# Patient Record
Sex: Male | Born: 1955 | Hispanic: No | Marital: Married
Health system: Southern US, Community
[De-identification: ages and names within clinical notes are randomized; demographics above are authoritative.]

---

## 1999-03-21 ENCOUNTER — Other Ambulatory Visit: Admission: RE | Admit: 1999-03-21 | Discharge: 1999-03-21 | Payer: Self-pay | Admitting: Family Medicine

## 2001-07-01 ENCOUNTER — Ambulatory Visit (HOSPITAL_COMMUNITY): Admission: RE | Admit: 2001-07-01 | Discharge: 2001-07-01 | Payer: Self-pay | Admitting: Family Medicine

## 2001-07-01 ENCOUNTER — Encounter: Payer: Self-pay | Admitting: Family Medicine

## 2007-03-21 ENCOUNTER — Ambulatory Visit: Payer: Self-pay | Admitting: Internal Medicine

## 2007-03-21 DIAGNOSIS — L0292 Furuncle, unspecified: Secondary | ICD-10-CM | POA: Insufficient documentation

## 2007-03-21 DIAGNOSIS — L0293 Carbuncle, unspecified: Secondary | ICD-10-CM

## 2007-03-21 HISTORY — DX: Furuncle, unspecified: L02.92

## 2009-09-03 ENCOUNTER — Ambulatory Visit: Payer: Self-pay | Admitting: Internal Medicine

## 2009-09-03 ENCOUNTER — Observation Stay (HOSPITAL_COMMUNITY): Admission: EM | Admit: 2009-09-03 | Discharge: 2009-09-03 | Payer: Self-pay | Admitting: Emergency Medicine

## 2010-09-22 LAB — CBC
HCT: 42.2 % (ref 39.0–52.0)
Hemoglobin: 14.4 g/dL (ref 13.0–17.0)
MCHC: 34.2 g/dL (ref 30.0–36.0)
MCV: 89.5 fL (ref 78.0–100.0)
Platelets: 216 10*3/uL (ref 150–400)
RBC: 4.71 MIL/uL (ref 4.22–5.81)
RDW: 13 % (ref 11.5–15.5)
WBC: 5.3 10*3/uL (ref 4.0–10.5)

## 2010-09-22 LAB — DIFFERENTIAL
Basophils Absolute: 0 10*3/uL (ref 0.0–0.1)
Basophils Relative: 0 % (ref 0–1)
Eosinophils Absolute: 0.1 10*3/uL (ref 0.0–0.7)
Eosinophils Relative: 2 % (ref 0–5)
Lymphocytes Relative: 22 % (ref 12–46)
Lymphs Abs: 1.2 10*3/uL (ref 0.7–4.0)
Monocytes Absolute: 0.4 10*3/uL (ref 0.1–1.0)
Monocytes Relative: 7 % (ref 3–12)
Neutro Abs: 3.7 10*3/uL (ref 1.7–7.7)
Neutrophils Relative %: 70 % (ref 43–77)

## 2010-09-22 LAB — COMPREHENSIVE METABOLIC PANEL
ALT: 14 U/L (ref 0–53)
AST: 17 U/L (ref 0–37)
Albumin: 3.9 g/dL (ref 3.5–5.2)
Alkaline Phosphatase: 40 U/L (ref 39–117)
BUN: 9 mg/dL (ref 6–23)
CO2: 28 mEq/L (ref 19–32)
Calcium: 9 mg/dL (ref 8.4–10.5)
Chloride: 108 mEq/L (ref 96–112)
Creatinine, Ser: 0.91 mg/dL (ref 0.4–1.5)
GFR calc Af Amer: >60 mL/min (ref >60)
GFR calc non Af Amer: >60 mL/min (ref >60)
Glucose, Bld: 99 mg/dL (ref 70–99)
Potassium: 3.9 mEq/L (ref 3.5–5.1)
Sodium: 141 mEq/L (ref 135–145)
Total Bilirubin: 0.6 mg/dL (ref 0.3–1.2)
Total Protein: 6.2 g/dL (ref 6.0–8.3)

## 2010-09-22 LAB — POCT CARDIAC MARKERS
CKMB, poc: <1 ng/mL — ABNORMAL LOW (ref 1.0–8.0)
CKMB, poc: <1 ng/mL — ABNORMAL LOW (ref 1.0–8.0)
Myoglobin, poc: 44.6 ng/mL (ref 12–200)

## 2011-03-22 IMAGING — CR DG CHEST 2V
2 series · 2 of 2 positions shown · non-contrast
Comparison: None

CLINICAL DATA: Chest pain, arrhythmia

CHEST - 2 VIEW

[w chest pa]
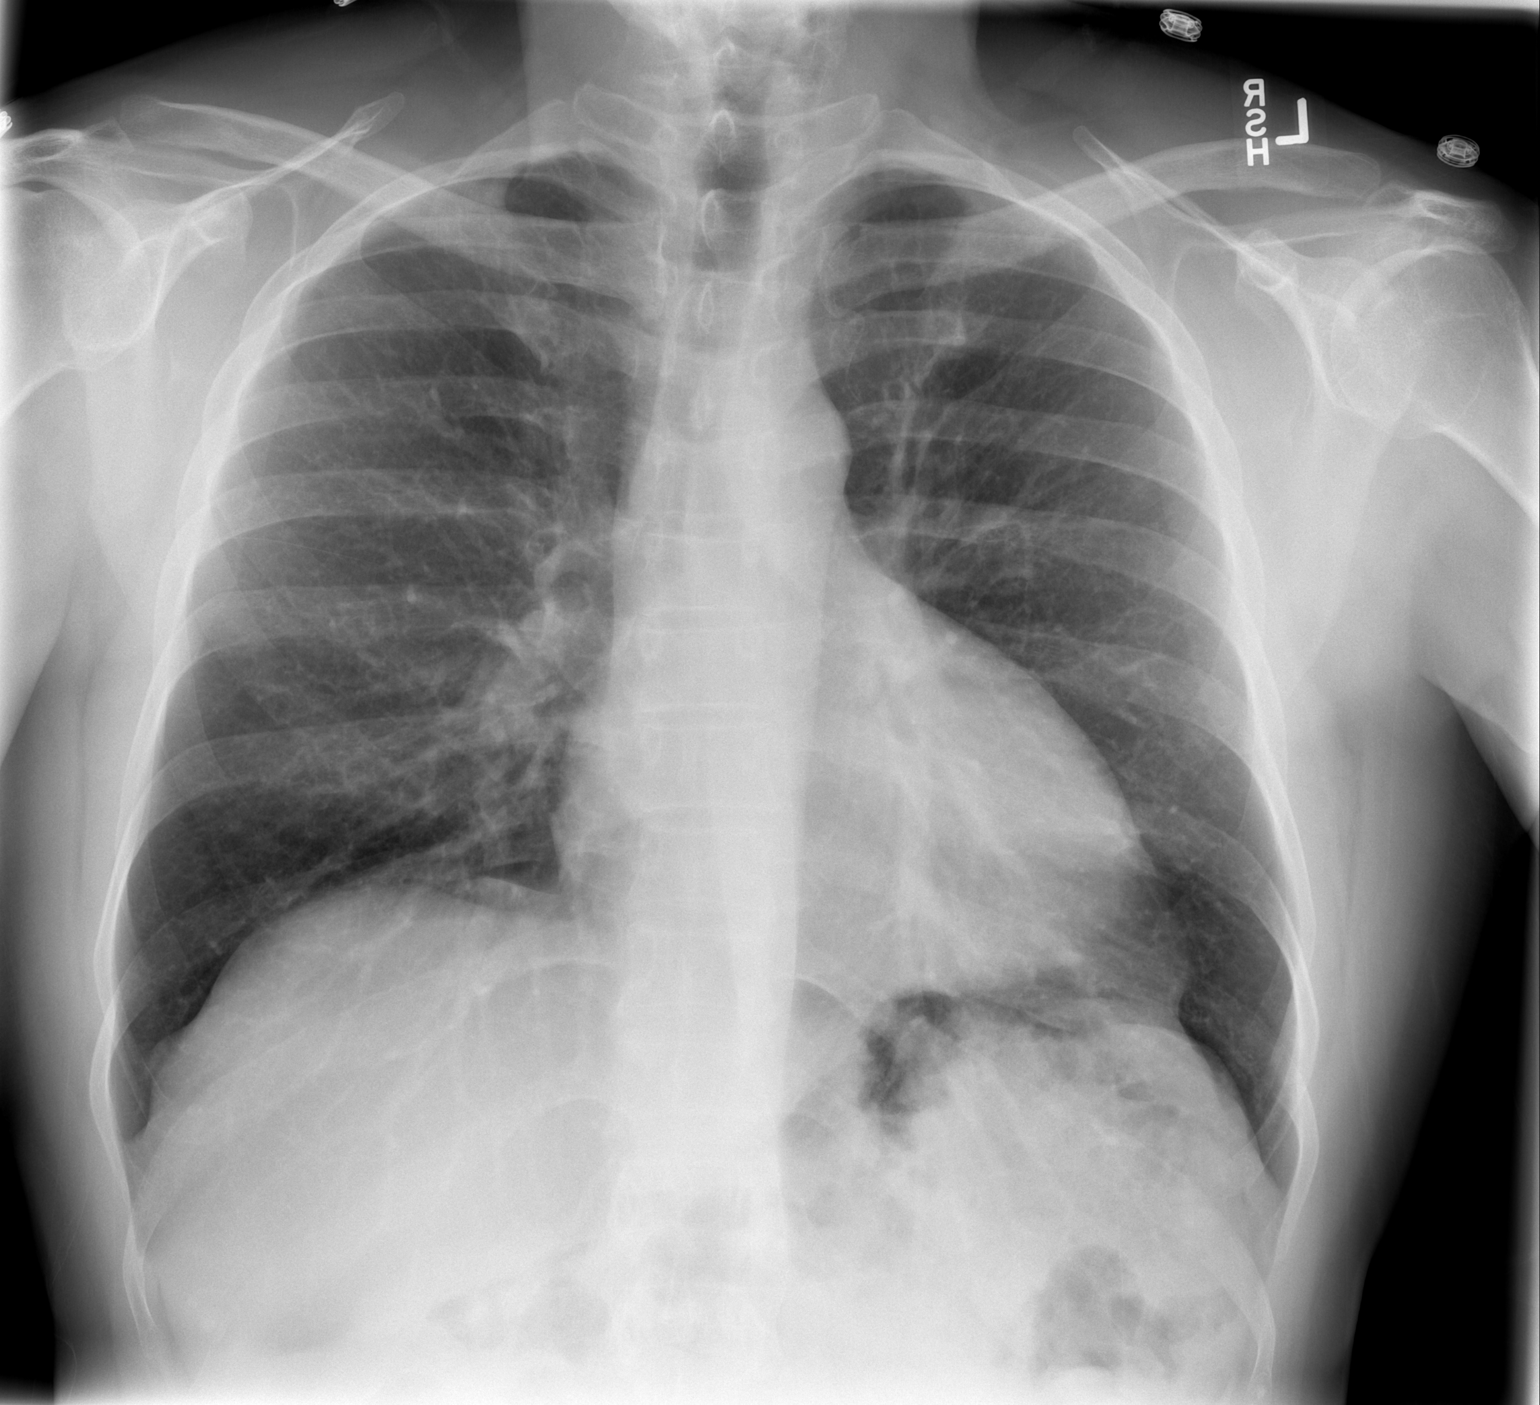

[w chest lat]
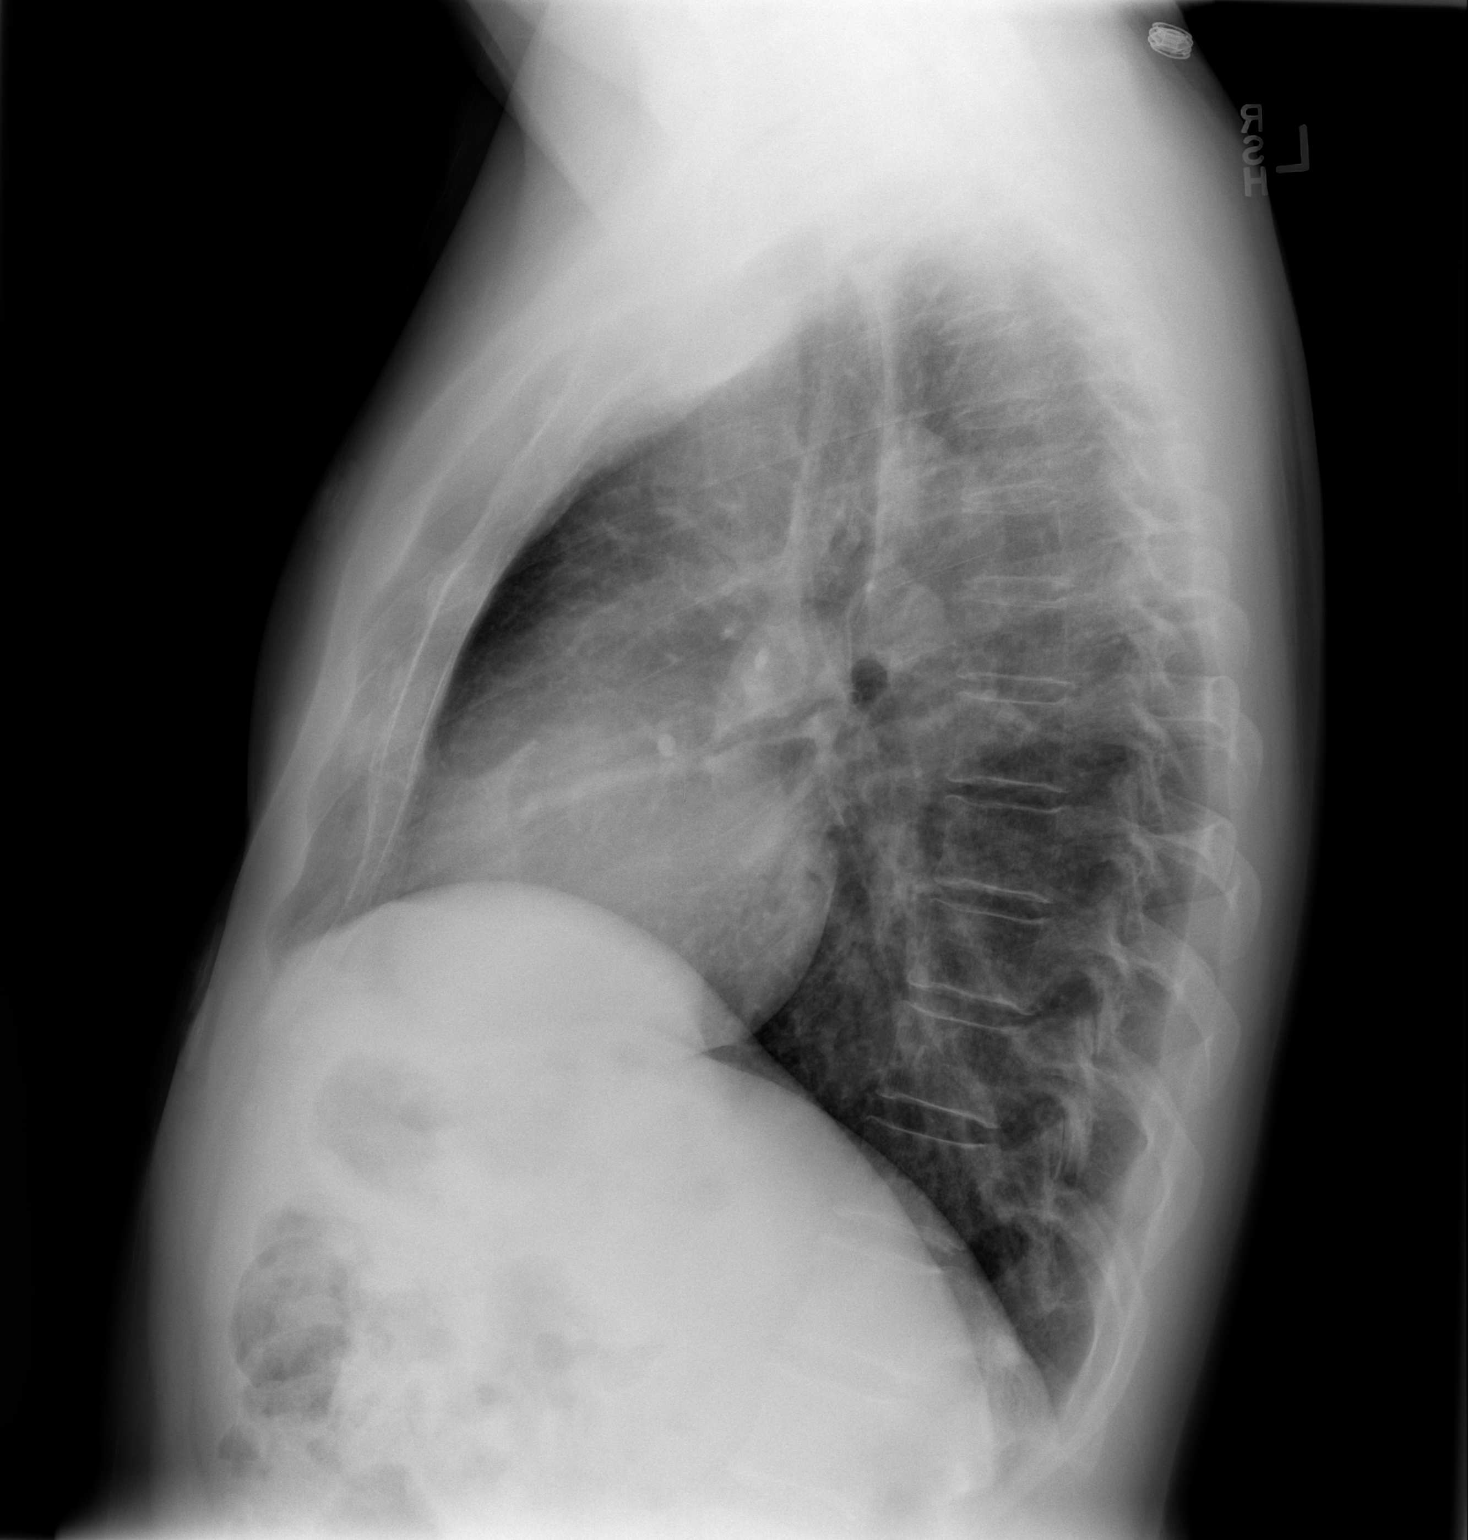

[2 of 2 positions shown; findings below may reference images not displayed]

FINDINGS: Upper normal heart size.
Normal mediastinal contours and pulmonary vascularity.
Question minimal linear subsegmental atelectasis retrocardiac left
lower lobe.
Remaining lungs clear.
No pleural effusion or pneumothorax.
Bones unremarkable.
IMPRESSION: Question minimal linear atelectasis left lower lobe.

## 2016-06-29 DIAGNOSIS — Z0000 Encounter for general adult medical examination without abnormal findings: Secondary | ICD-10-CM | POA: Diagnosis not present

## 2016-08-19 DIAGNOSIS — M7711 Lateral epicondylitis, right elbow: Secondary | ICD-10-CM | POA: Diagnosis not present

## 2016-09-17 DIAGNOSIS — H5319 Other subjective visual disturbances: Secondary | ICD-10-CM | POA: Diagnosis not present

## 2016-09-17 DIAGNOSIS — H35341 Macular cyst, hole, or pseudohole, right eye: Secondary | ICD-10-CM | POA: Diagnosis not present

## 2016-09-17 DIAGNOSIS — H35373 Puckering of macula, bilateral: Secondary | ICD-10-CM | POA: Diagnosis not present

## 2016-09-17 DIAGNOSIS — H35413 Lattice degeneration of retina, bilateral: Secondary | ICD-10-CM | POA: Diagnosis not present

## 2016-10-03 DIAGNOSIS — S70361A Insect bite (nonvenomous), right thigh, initial encounter: Secondary | ICD-10-CM | POA: Diagnosis not present

## 2016-10-03 DIAGNOSIS — S90561A Insect bite (nonvenomous), right ankle, initial encounter: Secondary | ICD-10-CM | POA: Diagnosis not present

## 2016-10-07 DIAGNOSIS — S70369A Insect bite (nonvenomous), unspecified thigh, initial encounter: Secondary | ICD-10-CM | POA: Diagnosis not present

## 2017-01-18 DIAGNOSIS — E7800 Pure hypercholesterolemia, unspecified: Secondary | ICD-10-CM | POA: Diagnosis not present

## 2017-01-18 DIAGNOSIS — Z87898 Personal history of other specified conditions: Secondary | ICD-10-CM | POA: Diagnosis not present

## 2017-01-18 DIAGNOSIS — I10 Essential (primary) hypertension: Secondary | ICD-10-CM | POA: Diagnosis not present

## 2017-01-18 DIAGNOSIS — Z1211 Encounter for screening for malignant neoplasm of colon: Secondary | ICD-10-CM | POA: Diagnosis not present

## 2017-04-29 DIAGNOSIS — B078 Other viral warts: Secondary | ICD-10-CM | POA: Diagnosis not present

## 2017-04-29 DIAGNOSIS — L810 Postinflammatory hyperpigmentation: Secondary | ICD-10-CM | POA: Diagnosis not present

## 2017-04-30 DIAGNOSIS — R972 Elevated prostate specific antigen [PSA]: Secondary | ICD-10-CM | POA: Diagnosis not present

## 2017-05-17 DIAGNOSIS — N400 Enlarged prostate without lower urinary tract symptoms: Secondary | ICD-10-CM | POA: Diagnosis not present

## 2017-05-17 DIAGNOSIS — R972 Elevated prostate specific antigen [PSA]: Secondary | ICD-10-CM | POA: Diagnosis not present

## 2017-05-17 DIAGNOSIS — R310 Gross hematuria: Secondary | ICD-10-CM | POA: Diagnosis not present

## 2017-06-29 DIAGNOSIS — Z0000 Encounter for general adult medical examination without abnormal findings: Secondary | ICD-10-CM | POA: Diagnosis not present

## 2017-08-20 DIAGNOSIS — R05 Cough: Secondary | ICD-10-CM | POA: Diagnosis not present

## 2017-09-16 DIAGNOSIS — H35373 Puckering of macula, bilateral: Secondary | ICD-10-CM | POA: Diagnosis not present

## 2017-09-16 DIAGNOSIS — H35341 Macular cyst, hole, or pseudohole, right eye: Secondary | ICD-10-CM | POA: Diagnosis not present

## 2017-09-16 DIAGNOSIS — H35413 Lattice degeneration of retina, bilateral: Secondary | ICD-10-CM | POA: Diagnosis not present

## 2018-01-25 DIAGNOSIS — Z0000 Encounter for general adult medical examination without abnormal findings: Secondary | ICD-10-CM | POA: Diagnosis not present

## 2018-01-25 DIAGNOSIS — I10 Essential (primary) hypertension: Secondary | ICD-10-CM | POA: Diagnosis not present

## 2018-01-25 DIAGNOSIS — E7800 Pure hypercholesterolemia, unspecified: Secondary | ICD-10-CM | POA: Diagnosis not present

## 2018-05-20 DIAGNOSIS — Z8249 Family history of ischemic heart disease and other diseases of the circulatory system: Secondary | ICD-10-CM | POA: Diagnosis not present

## 2018-05-20 DIAGNOSIS — I10 Essential (primary) hypertension: Secondary | ICD-10-CM | POA: Diagnosis not present

## 2018-05-30 DIAGNOSIS — N400 Enlarged prostate without lower urinary tract symptoms: Secondary | ICD-10-CM | POA: Diagnosis not present

## 2018-05-30 DIAGNOSIS — R972 Elevated prostate specific antigen [PSA]: Secondary | ICD-10-CM | POA: Diagnosis not present

## 2018-05-30 DIAGNOSIS — R310 Gross hematuria: Secondary | ICD-10-CM | POA: Diagnosis not present

## 2020-01-26 ENCOUNTER — Ambulatory Visit (INDEPENDENT_AMBULATORY_CARE_PROVIDER_SITE_OTHER): Payer: 59 | Admitting: Family Medicine

## 2020-01-26 ENCOUNTER — Other Ambulatory Visit: Payer: Self-pay

## 2020-01-26 ENCOUNTER — Encounter: Payer: Self-pay | Admitting: Family Medicine

## 2020-01-26 VITALS — BP 132/82 | Ht 66.0 in | Wt 178.0 lb

## 2020-01-26 DIAGNOSIS — M7918 Myalgia, other site: Secondary | ICD-10-CM

## 2020-01-26 DIAGNOSIS — I10 Essential (primary) hypertension: Secondary | ICD-10-CM | POA: Diagnosis not present

## 2020-01-26 NOTE — Progress Notes (Signed)
  Taylor Barton - 64 y.o. male MRN [id number]  Date of birth: 08/24/1955    SUBJECTIVE:      Chief Complaint:/ HPI:  Right arm pain: Patient is a pleasant 64 year old male presenting to clinic today for concerns for right lateral elbow pain.  Reports that a couple years ago he had a bout of tendinitis on the lateral elbow (tennis elbow) which he treated with a pressure sleeve and band exercises.  These help to keep the pain away and help the arm to function.  Patient reports that the end of May he was working out when he added on 5 extra pounds to a barbell curl and started feeling significant pain and tenderness at the proximal aspect of his forearm and about the brachioradialis muscle.  Pain is worse with forearm supination and neutral position with full flexion.  Denies any falls, injuries.  Has been treating the site occasionally with ice and has been wearing a compression device over the proximal forearm to reduce this pain.  Primarily wears the compression device when exercising, reports he is not persistent with icing.  Pain improves with ice and rest.  Pain gets worse when he overexerts.  Has not tried any Tylenol or Advil due to being on losartan for high blood pressure.  No report of rashes, fevers, myalgias, arm tingling, numbness, weakness, swelling, bruising, or loss of sensation appreciated.   ROS:     See HPI  PERTINENT  PMH / PSH FH / / SH:  Past Medical, Surgical, Social, and Family History Reviewed & Updated in the EMR.  Pertinent findings include:  -History of hypertension - takes Telmisartan-HCTZ -Takes glucosamine/chondroitin for joint health -Takes Viagra as needed  OBJECTIVE: BP (!) 132/82   Ht 5\' 6"  (1.676 m)   Wt 178 lb (80.7 kg)   BMI 28.73 kg/m   Physical Exam:  Vital signs are reviewed.  GEN: Alert and oriented, NAD Pulm: Breathing unlabored PSY: normal mood, congruent affect  MSK: Right elbow: - Inspection: no obvious deformity. No swelling, erythema or  bruising - Palpation: Minimal TTP to proximal brachial radialis.  No TTP over lateral epicondyle. - ROM: full active ROM in flexion and extension.  Pain with flexion particularly while forearm in neutral position.  No crepitus.  Pain with resisted radial deviation.  No pain at lateral epicondyle with wrist extension and 3rd digit extension. - Strength: 5/5 strength in wrist flexion and extension without pain. - Neuro: NV intact distally  Limited ultrasound, right elbow/proximal forearm: Grade 1 strain in proximal brachioradialis as indicated by hypoechoic areas.  Normal biceps tendon appreciated from both medial and lateral views.  Normal-appearing radial head.  No other concerning findings  ASSESSMENT & PLAN:  1.  Brachioradialis strain, right: Grade 1 strain appreciated on ultrasound, consistent with patient's clinical picture and history. -Recommend patient continues home band work but reduce reps from 30 down to 10 per each movement.  Avoid motions that hurt. -Ice the site after exercising 15 minutes x 2 with one 15-minute break in between them -Continue to wear compression device to elbow -Can consider formal physical therapy if conservative measures at home fail -Can consider nitroglycerin patch although this is not a great option for him considering he is on sildenafil. -Follow-up 4-6 weeks or sooner as needed   , DO [facility], PGY-3 01/26/2020 9:40 AM

## 2020-01-26 NOTE — Assessment & Plan Note (Signed)
Right arm. Grade 1 strain appreciated on ultrasound, consistent with patient's clinical picture and history. -Recommend patient continues home band work but reduce reps from 30 down to 10 per each movement.  Avoid motions that hurt. -Ice the site after exercising 15 minutes x 2 with one 15-minute break in between them -Continue to wear compression device to elbow -Can consider formal physical therapy if conservative measures at home fail -Can consider nitroglycerin patch although this is not a great option for him considering he is on sildenafil. -Follow-up 4-6 weeks or sooner as needed

## 2020-01-26 NOTE — Patient Instructions (Addendum)
Thank you for coming in to see [place] today!  You have a small grade 1 strain in your brachioradialis.  Please see below to review our plan for today's visit:  1.  Continue doing your band work at home as you have been doing but reduce the amount of reps for each movement down to 10.  Avoid motions that hurt. 2.  Continue to wear your compression sleeve. 3.  Try icing the site 15 minutes on-15 off-15 on after exercising. 4.  You can consider formal physical therapy if the home measures are not improving your pain, function.  We can also consider nitroglycerin patches but these are not a great option while on sildenafil/Cialis. 5.  Follow-up 4-6 weeks or sooner as needed.  Please call the clinic at [phone] if your symptoms worsen or you have any concerns. It was our pleasure to serve you!   Dr. Norton Blizzard Dr. Peggyann Shoals [facility]

## 2020-01-29 ENCOUNTER — Encounter: Payer: Self-pay | Admitting: Family Medicine

## 2023-04-02 ENCOUNTER — Other Ambulatory Visit: Payer: Self-pay | Admitting: Family Medicine

## 2023-04-02 DIAGNOSIS — Z136 Encounter for screening for cardiovascular disorders: Secondary | ICD-10-CM

## 2023-04-19 ENCOUNTER — Ambulatory Visit
Admission: RE | Admit: 2023-04-19 | Discharge: 2023-04-19 | Disposition: A | Discharge status: Home / Self Care | Payer: Medicare Other | Source: Ambulatory Visit | Attending: Family Medicine | Admitting: Family Medicine

## 2023-04-19 DIAGNOSIS — Z136 Encounter for screening for cardiovascular disorders: Secondary | ICD-10-CM
